# Patient Record
Sex: Female | Born: 1995 | Race: Black or African American | Hispanic: No | Marital: Single | Smoking: Never smoker
Health system: Southern US, Community
[De-identification: ages and names within clinical notes are randomized; demographics above are authoritative.]

---

## 2019-03-20 ENCOUNTER — Other Ambulatory Visit: Payer: Self-pay

## 2019-03-20 ENCOUNTER — Encounter (HOSPITAL_COMMUNITY): Payer: Self-pay | Admitting: Emergency Medicine

## 2019-03-20 ENCOUNTER — Emergency Department (HOSPITAL_COMMUNITY)
Admission: EM | Admit: 2019-03-20 | Discharge: 2019-03-20 | Disposition: A | Discharge status: Home / Self Care | Payer: Federal, State, Local not specified - PPO | Attending: Emergency Medicine | Admitting: Emergency Medicine

## 2019-03-20 ENCOUNTER — Emergency Department (HOSPITAL_COMMUNITY): Payer: Federal, State, Local not specified - PPO

## 2019-03-20 DIAGNOSIS — R51 Headache: Secondary | ICD-10-CM | POA: Insufficient documentation

## 2019-03-20 DIAGNOSIS — Z20828 Contact with and (suspected) exposure to other viral communicable diseases: Secondary | ICD-10-CM | POA: Insufficient documentation

## 2019-03-20 DIAGNOSIS — Z20822 Contact with and (suspected) exposure to covid-19: Secondary | ICD-10-CM

## 2019-03-20 DIAGNOSIS — R072 Precordial pain: Secondary | ICD-10-CM | POA: Diagnosis not present

## 2019-03-20 DIAGNOSIS — M791 Myalgia, unspecified site: Secondary | ICD-10-CM | POA: Insufficient documentation

## 2019-03-20 DIAGNOSIS — R6889 Other general symptoms and signs: Secondary | ICD-10-CM

## 2019-03-20 DIAGNOSIS — R079 Chest pain, unspecified: Secondary | ICD-10-CM | POA: Diagnosis present

## 2019-03-20 LAB — BASIC METABOLIC PANEL
Anion gap: 10 (ref 5–15)
BUN: 13 mg/dL (ref 6–20)
CO2: 23 mmol/L (ref 22–32)
Calcium: 9.5 mg/dL (ref 8.9–10.3)
Chloride: 102 mmol/L (ref 98–111)
Creatinine, Ser: 0.82 mg/dL (ref 0.44–1.00)
GFR calc Af Amer: >60 mL/min (ref >60)
GFR calc non Af Amer: >60 mL/min (ref >60)
Glucose, Bld: 102 mg/dL — ABNORMAL HIGH (ref 70–99)
Potassium: 3.6 mmol/L (ref 3.5–5.1)
Sodium: 135 mmol/L (ref 135–145)

## 2019-03-20 LAB — CBC
HCT: 42.4 % (ref 36.0–46.0)
Hemoglobin: 14 g/dL (ref 12.0–15.0)
MCH: 28.5 pg (ref 26.0–34.0)
MCHC: 33 g/dL (ref 30.0–36.0)
MCV: 86.2 fL (ref 80.0–100.0)
Platelets: 253 10*3/uL (ref 150–400)
RBC: 4.92 MIL/uL (ref 3.87–5.11)
RDW: 12.2 % (ref 11.5–15.5)
WBC: 3.8 10*3/uL — ABNORMAL LOW (ref 4.0–10.5)
nRBC: 0 % (ref 0.0–0.2)

## 2019-03-20 LAB — TROPONIN I: Troponin I: <0.03 ng/mL (ref <0.03)

## 2019-03-20 LAB — SARS CORONAVIRUS 2 BY RT PCR (HOSPITAL ORDER, PERFORMED IN ~~LOC~~ HOSPITAL LAB): SARS Coronavirus 2: NEGATIVE

## 2019-03-20 LAB — I-STAT BETA HCG BLOOD, ED (NOT ORDERABLE): I-stat hCG, quantitative: <5 m[IU]/mL (ref <5)

## 2019-03-20 NOTE — ED Triage Notes (Signed)
Pt c/o central chest tightness that started last night. Today has headache and leg pains. 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

## 2019-03-20 NOTE — ED Provider Notes (Signed)
[facility]-EMERGENCY DEPT Provider Note   CSN: [id number] Arrival date & time: 03/20/19  1303    History   Chief Complaint Chief Complaint  Patient presents with  . Chest Pain  . Headache  . Leg Pain    HPI    Tracy Mosley is a 23 y.o. female who presents to the ED with complaints of chest pain that began last night and continued today with associated headaches, lower body aches, nasal congestion that began today.  Patient works at [employer], and she is concerned for the possibility of this.  She describes her pain as 7/10 constant tightness in the center of her chest radiating somewhat into the upper back, worse with laying, sitting, or movement of her chest, and with no treatments tried prior to arrival.  This pain is unchanged with inspiration.  No known sick contacts.  No recent travel.  She is a non-smoker.  She is most concerned with the possibility of having COVID-19.  She has a maternal grandmother who passed away of an MI in her late 4s but no other known family history of cardiac disease.  She denies any cough, ear pain or drainage, sore throat, fevers, chills, diaphoresis, lightheadedness, shortness of breath, leg swelling, recent travel/surgery/immobilization, estrogen use, personal or family history of DVT/PE, abdominal pain, nausea, vomiting, diarrhea, constipation, dysuria, hematuria, numbness, tingling, focal weakness, claudication, orthopnea, or any other complaints at this time.  The history is provided by the patient and medical records. No language interpreter was used.  Chest Pain  Associated symptoms: headache   Associated symptoms: no abdominal pain, no cough, no diaphoresis, no fever, no nausea, no numbness, no shortness of breath, no vomiting and no weakness   Headache  Associated symptoms: congestion and myalgias   Associated symptoms: no abdominal pain, no cough, no diarrhea, no ear pain, no fever, no nausea, no  numbness, no sore throat, no vomiting and no weakness   Leg Pain  Associated symptoms: no fever     History reviewed. No pertinent past medical history.  There are no active problems to display for this patient.   History reviewed. No pertinent surgical history.   OB History   No obstetric history on file.      Home Medications    Prior to Admission medications   Not on File    Family History No family history on file.  Social History Social History   Tobacco Use  . Smoking status: Never Smoker  . Smokeless tobacco: Never Used  Substance Use Topics  . Alcohol use: Yes  . Drug use: Not on file     Allergies   Patient has no known allergies.   Review of Systems Review of Systems  Constitutional: Negative for chills, diaphoresis and fever.  HENT: Positive for congestion. Negative for ear discharge, ear pain and sore throat.   Respiratory: Negative for cough and shortness of breath.   Cardiovascular: Positive for chest pain. Negative for leg swelling.  Gastrointestinal: Negative for abdominal pain, constipation, diarrhea, nausea and vomiting.  Genitourinary: Negative for dysuria and hematuria.  Musculoskeletal: Positive for myalgias. Negative for arthralgias.  Skin: Negative for color change.  Allergic/Immunologic: Negative for immunocompromised state.  Neurological: Positive for headaches. Negative for weakness, light-headedness and numbness.  Psychiatric/Behavioral: Negative for confusion.   All other systems reviewed and are negative for acute change except as noted in the HPI.    Physical Exam Updated Vital Signs BP (!) 118/93 (BP Location: Right  Arm)   Pulse 74   Temp 98.4 F (36.9 C) (Oral)   Resp 12   Ht 5\' 4"  (1.626 m)   Wt 69.9 kg   LMP 03/10/2019   SpO2 100%   BMI 26.43 kg/m   Physical Exam Vitals signs and nursing note reviewed.  Constitutional:      General: She is not in acute distress.    Appearance: Normal appearance. She is  well-developed. She is not toxic-appearing.     Comments: Afebrile, nontoxic, NAD  HENT:     Head: Normocephalic and atraumatic.     Nose: Congestion present.     Comments: Mild congestion to nasal mucosa Eyes:     General:        Right eye: No discharge.        Left eye: No discharge.     Conjunctiva/sclera: Conjunctivae normal.  Neck:     Musculoskeletal: Normal range of motion and neck supple.  Cardiovascular:     Rate and Rhythm: Normal rate and regular rhythm.     Pulses: Normal pulses.     Heart sounds: Normal heart sounds, S1 normal and S2 normal. No murmur. No friction rub. No gallop.      Comments: RRR, nl s1/s2, no m/r/g, distal pulses intact, no pedal edema  Pulmonary:     Effort: Pulmonary effort is normal. No respiratory distress.     Breath sounds: Normal breath sounds. No decreased breath sounds, wheezing, rhonchi or rales.     Comments: CTAB in all lung fields, no w/r/r, no hypoxia or increased WOB, speaking in full sentences, SpO2 100% on RA  Chest:     Chest wall: Tenderness present. No deformity or crepitus.       Comments: Chest wall mildly TTP anteriorly and mostly centrally, without crepitus, deformities, or retractions  Abdominal:     General: Bowel sounds are normal. There is no distension.     Palpations: Abdomen is soft. Abdomen is not rigid.     Tenderness: There is no abdominal tenderness. There is no right CVA tenderness, left CVA tenderness, guarding or rebound. Negative signs include Murphy's sign and McBurney's sign.  Musculoskeletal: Normal range of motion.     Comments: MAE x4 Strength and sensation grossly intact in all extremities Distal pulses intact Gait steady No pedal edema, neg homan's bilaterally   Skin:    General: Skin is warm and dry.     Findings: No rash.  Neurological:     Mental Status: She is alert and oriented to person, place, and time.     Sensory: Sensation is intact. No sensory deficit.     Motor: Motor function is  intact.  Psychiatric:        Mood and Affect: Mood and affect normal.        Behavior: Behavior normal.      ED Treatments / Results  Labs (all labs ordered are listed, but only abnormal results are displayed) Labs Reviewed  BASIC METABOLIC PANEL - Abnormal; Notable for the following components:      Result Value   Glucose, Bld 102 (*)    All other components within normal limits  CBC - Abnormal; Notable for the following components:   WBC 3.8 (*)    All other components within normal limits  NOVEL CORONAVIRUS, NAA (HOSPITAL ORDER, SEND-OUT TO REF LAB)  TROPONIN I  I-STAT BETA HCG BLOOD, ED (NOT ORDERABLE)    EKG None  Radiology Dg Chest 2 View  Result  Date: 03/20/2019 CLINICAL DATA:  Chest pain EXAM: CHEST - 2 VIEW COMPARISON:  None. FINDINGS: The lungs are clear. Heart size and pulmonary vascularity are normal. No adenopathy. No pneumothorax. No bone lesions. IMPRESSION: No abnormality noted. Electronically Signed   By: Bretta Bang III M.D.   On: 03/20/2019 13:47    Procedures Procedures (including critical care time)  Medications Ordered in ED Medications - No data to display   Initial Impression / Assessment and Plan / ED Course  I have reviewed the triage vital signs and the nursing notes.  Pertinent labs & imaging results that were available during my care of the patient were reviewed by me and considered in my medical decision making (see chart for details).        23 y.o. female here with chest tightness that began yesterday as well as headaches, body aches, nasal congestion that began today.  Patient works at [employer] and she is most concerned about this possibility.  On exam, mild nasal congestion, lung sounds clear, mild chest wall tenderness anteriorly, no leg swelling or tachycardia, PERC negative, afebrile and nontoxic, well-appearing.  Work-up thus far reveals: BMP WNL, CBC fairly unremarkable, trop neg at >12hr mark, betaHCG neg,  CXR neg, EKG nonischemic and without acute findings. COVID19 is a possibility given her symptoms, pt is concerned for this and would like testing, discussed that this would be outpatient testing and may have prolonged turn around time. Advised that she should still self-quarantine, guidelines of this advised. Doubt PE, dissection, ACS, etc. Doubt need for further emergent work up at this time. Suspect chest pain is musculoskeletal in origin. Advised OTC remedies for symptomatic relief, f/up with PCP in 1wk. I explained the diagnosis and have given explicit precautions to return to the ER including for any other new or worsening symptoms. The patient understands and accepts the medical plan as it's been dictated and I have answered their questions. Discharge instructions concerning home care and prescriptions have been given. The patient is STABLE and is discharged to home in good condition.    Final Clinical Impressions(s) / ED Diagnoses   Final diagnoses:  Precordial chest pain  Suspected Covid-19 Virus Infection    ED Discharge Orders    [address] [place], [place] 03/20/19 1628    Tilden Fossa, MD 03/20/19 [phone]

## 2019-03-20 NOTE — Discharge Instructions (Addendum)
You are being tested for the Novel Coronavirus infection. Please quarantine yourself for at least 7 days from symptom onset and at least 3 days fever free without medications (whichever is longer). Continue to stay well-hydrated. Gargle warm salt water and spit it out and use chloraseptic spray as needed for sore throat. Continue to alternate between Tylenol and Ibuprofen for pain or fever. Use Mucinex/Robitussin/etc for cough suppression/expectoration of mucus. Use over the counter flonase and the netipot to help with nasal congestion. May consider over-the-counter Benadryl or other antihistamine like Claritin/Zyrtec/etc to decrease secretions and for help with your symptoms. Follow up with your primary care doctor in 5-7 days for recheck of ongoing symptoms. Return to emergency department for emergent changing or worsening of symptoms.     Person Under Monitoring Name: Tracy Mosley  Location: [address] [place] [id number]   Infection Prevention Recommendations for Individuals Confirmed to have, or Being Evaluated for, 2019 Novel Coronavirus (COVID-19) Infection Who Receive Care at Home  Individuals who are confirmed to have, or are being evaluated for, COVID-19 should follow the prevention steps below until a healthcare provider or local or state health department says they can return to normal activities.  Stay home except to get medical care You should restrict activities outside your home, except for getting medical care. Do not go to work, school, or public areas, and do not use public transportation or taxis.  Call ahead before visiting your doctor Before your medical appointment, call the healthcare provider and tell them that you have, or are being evaluated for, COVID-19 infection. This will help the healthcare providers office take steps to keep other people from getting infected. Ask your healthcare provider to call the local or state health department.  Monitor  your symptoms Seek prompt medical attention if your illness is worsening (e.g., difficulty breathing). Before going to your medical appointment, call the healthcare provider and tell them that you have, or are being evaluated for, COVID-19 infection. Ask your healthcare provider to call the local or state health department.  Wear a facemask You should wear a facemask that covers your nose and mouth when you are in the same room with other people and when you visit a healthcare provider. People who live with or visit you should also wear a facemask while they are in the same room with you.  Separate yourself from other people in your home As much as possible, you should stay in a different room from other people in your home. Also, you should use a separate bathroom, if available.  Avoid sharing household items You should not share dishes, drinking glasses, cups, eating utensils, towels, bedding, or other items with other people in your home. After using these items, you should wash them thoroughly with soap and water.  Cover your coughs and sneezes Cover your mouth and nose with a tissue when you cough or sneeze, or you can cough or sneeze into your sleeve. Throw used tissues in a lined trash can, and immediately wash your hands with soap and water for at least 20 seconds or use an alcohol-based hand rub.  Wash your Union Pacific Corporationhands Wash your hands often and thoroughly with soap and water for at least 20 seconds. You can use an alcohol-based hand sanitizer if soap and water are not available and if your hands are not visibly dirty. Avoid touching your eyes, nose, and mouth with unwashed hands.   Prevention Steps for Caregivers and Household Members of Individuals Confirmed to have,  or Being Evaluated for, COVID-19 Infection Being Cared for in the Home  If you live with, or provide care at home for, a person confirmed to have, or being evaluated for, COVID-19 infection please follow these  guidelines to prevent infection:  Follow healthcare providers instructions Make sure that you understand and can help the patient follow any healthcare provider instructions for all care.  Provide for the patients basic needs You should help the patient with basic needs in the home and provide support for getting groceries, prescriptions, and other personal needs.  Monitor the patients symptoms If they are getting sicker, call his or her medical provider and tell them that the patient has, or is being evaluated for, COVID-19 infection. This will help the healthcare providers office take steps to keep other people from getting infected. Ask the healthcare provider to call the local or state health department.  Limit the number of people who have contact with the patient If possible, have only one caregiver for the patient. Other household members should stay in another home or place of residence. If this is not possible, they should stay in another room, or be separated from the patient as much as possible. Use a separate bathroom, if available. Restrict visitors who do not have an essential need to be in the home.  Keep older adults, very young children, and other sick people away from the patient Keep older adults, very young children, and those who have compromised immune systems or chronic health conditions away from the patient. This includes people with chronic heart, lung, or kidney conditions, diabetes, and cancer.  Ensure good ventilation Make sure that shared spaces in the home have good air flow, such as from an air conditioner or an opened window, weather permitting.  Wash your hands often Wash your hands often and thoroughly with soap and water for at least 20 seconds. You can use an alcohol based hand sanitizer if soap and water are not available and if your hands are not visibly dirty. Avoid touching your eyes, nose, and mouth with unwashed hands. Use disposable paper  towels to dry your hands. If not available, use dedicated cloth towels and replace them when they become wet.  Wear a facemask and gloves Wear a disposable facemask at all times in the room and gloves when you touch or have contact with the patients blood, body fluids, and/or secretions or excretions, such as sweat, saliva, sputum, nasal mucus, vomit, urine, or feces.  Ensure the mask fits over your nose and mouth tightly, and do not touch it during use. Throw out disposable facemasks and gloves after using them. Do not reuse. Wash your hands immediately after removing your facemask and gloves. If your personal clothing becomes contaminated, carefully remove clothing and launder. Wash your hands after handling contaminated clothing. Place all used disposable facemasks, gloves, and other waste in a lined container before disposing them with other household waste. Remove gloves and wash your hands immediately after handling these items.  Do not share dishes, glasses, or other household items with the patient Avoid sharing household items. You should not share dishes, drinking glasses, cups, eating utensils, towels, bedding, or other items with a patient who is confirmed to have, or being evaluated for, COVID-19 infection. After the person uses these items, you should wash them thoroughly with soap and water.  Wash laundry thoroughly Immediately remove and wash clothes or bedding that have blood, body fluids, and/or secretions or excretions, such as sweat, saliva, sputum, nasal  mucus, vomit, urine, or feces, on them. Wear gloves when handling laundry from the patient. Read and follow directions on labels of laundry or clothing items and detergent. In general, wash and dry with the warmest temperatures recommended on the label.  Clean all areas the individual has used often Clean all touchable surfaces, such as counters, tabletops, doorknobs, bathroom fixtures, toilets, phones, keyboards, tablets,  and bedside tables, every day. Also, clean any surfaces that may have blood, body fluids, and/or secretions or excretions on them. Wear gloves when cleaning surfaces the patient has come in contact with. Use a diluted bleach solution (e.g., dilute bleach with 1 part bleach and 10 parts water) or a household disinfectant with a label that says EPA-registered for coronaviruses. To make a bleach solution at home, add 1 tablespoon of bleach to 1 quart (4 cups) of water. For a larger supply, add  cup of bleach to 1 gallon (16 cups) of water. Read labels of cleaning products and follow recommendations provided on product labels. Labels contain instructions for safe and effective use of the cleaning product including precautions you should take when applying the product, such as wearing gloves or eye protection and making sure you have good ventilation during use of the product. Remove gloves and wash hands immediately after cleaning.  Monitor yourself for signs and symptoms of illness Caregivers and household members are considered close contacts, should monitor their health, and will be asked to limit movement outside of the home to the extent possible. Follow the monitoring steps for close contacts listed on the symptom monitoring form.   ? If you have additional questions, contact your local health department or call the epidemiologist on call at [phone] (available 24/7). ? This guidance is subject to change. For the most up-to-date guidance from [facility], please refer to their website: [email or web address]

## 2019-03-27 ENCOUNTER — Telehealth (HOSPITAL_COMMUNITY): Payer: Self-pay

## 2020-03-31 IMAGING — CR CHEST - 2 VIEW
2 series · 2 of 2 positions shown · non-contrast
Comparison: None.

CLINICAL DATA: Chest pain

EXAM:
CHEST - 2 VIEW

[w chest pa]
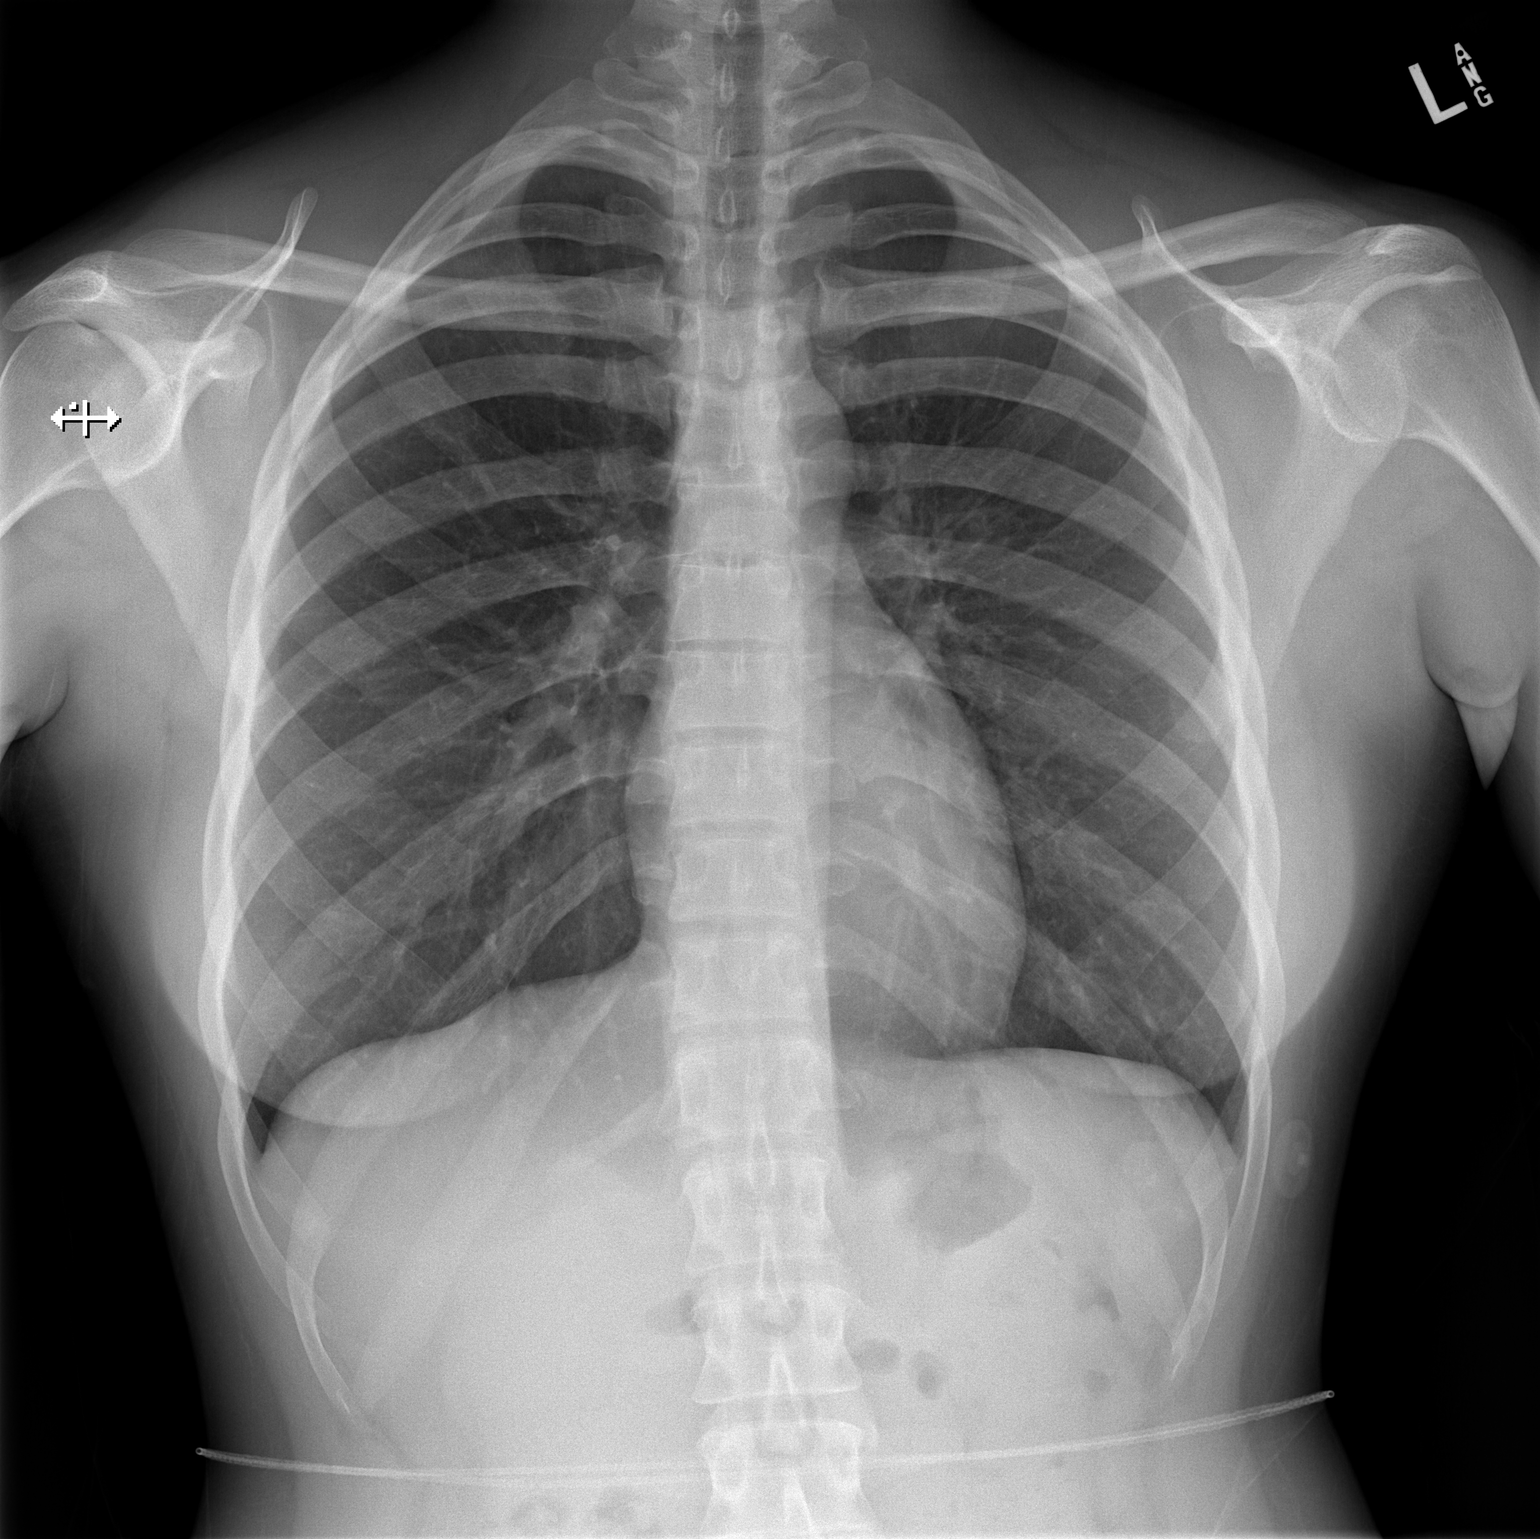

[w chest lat]
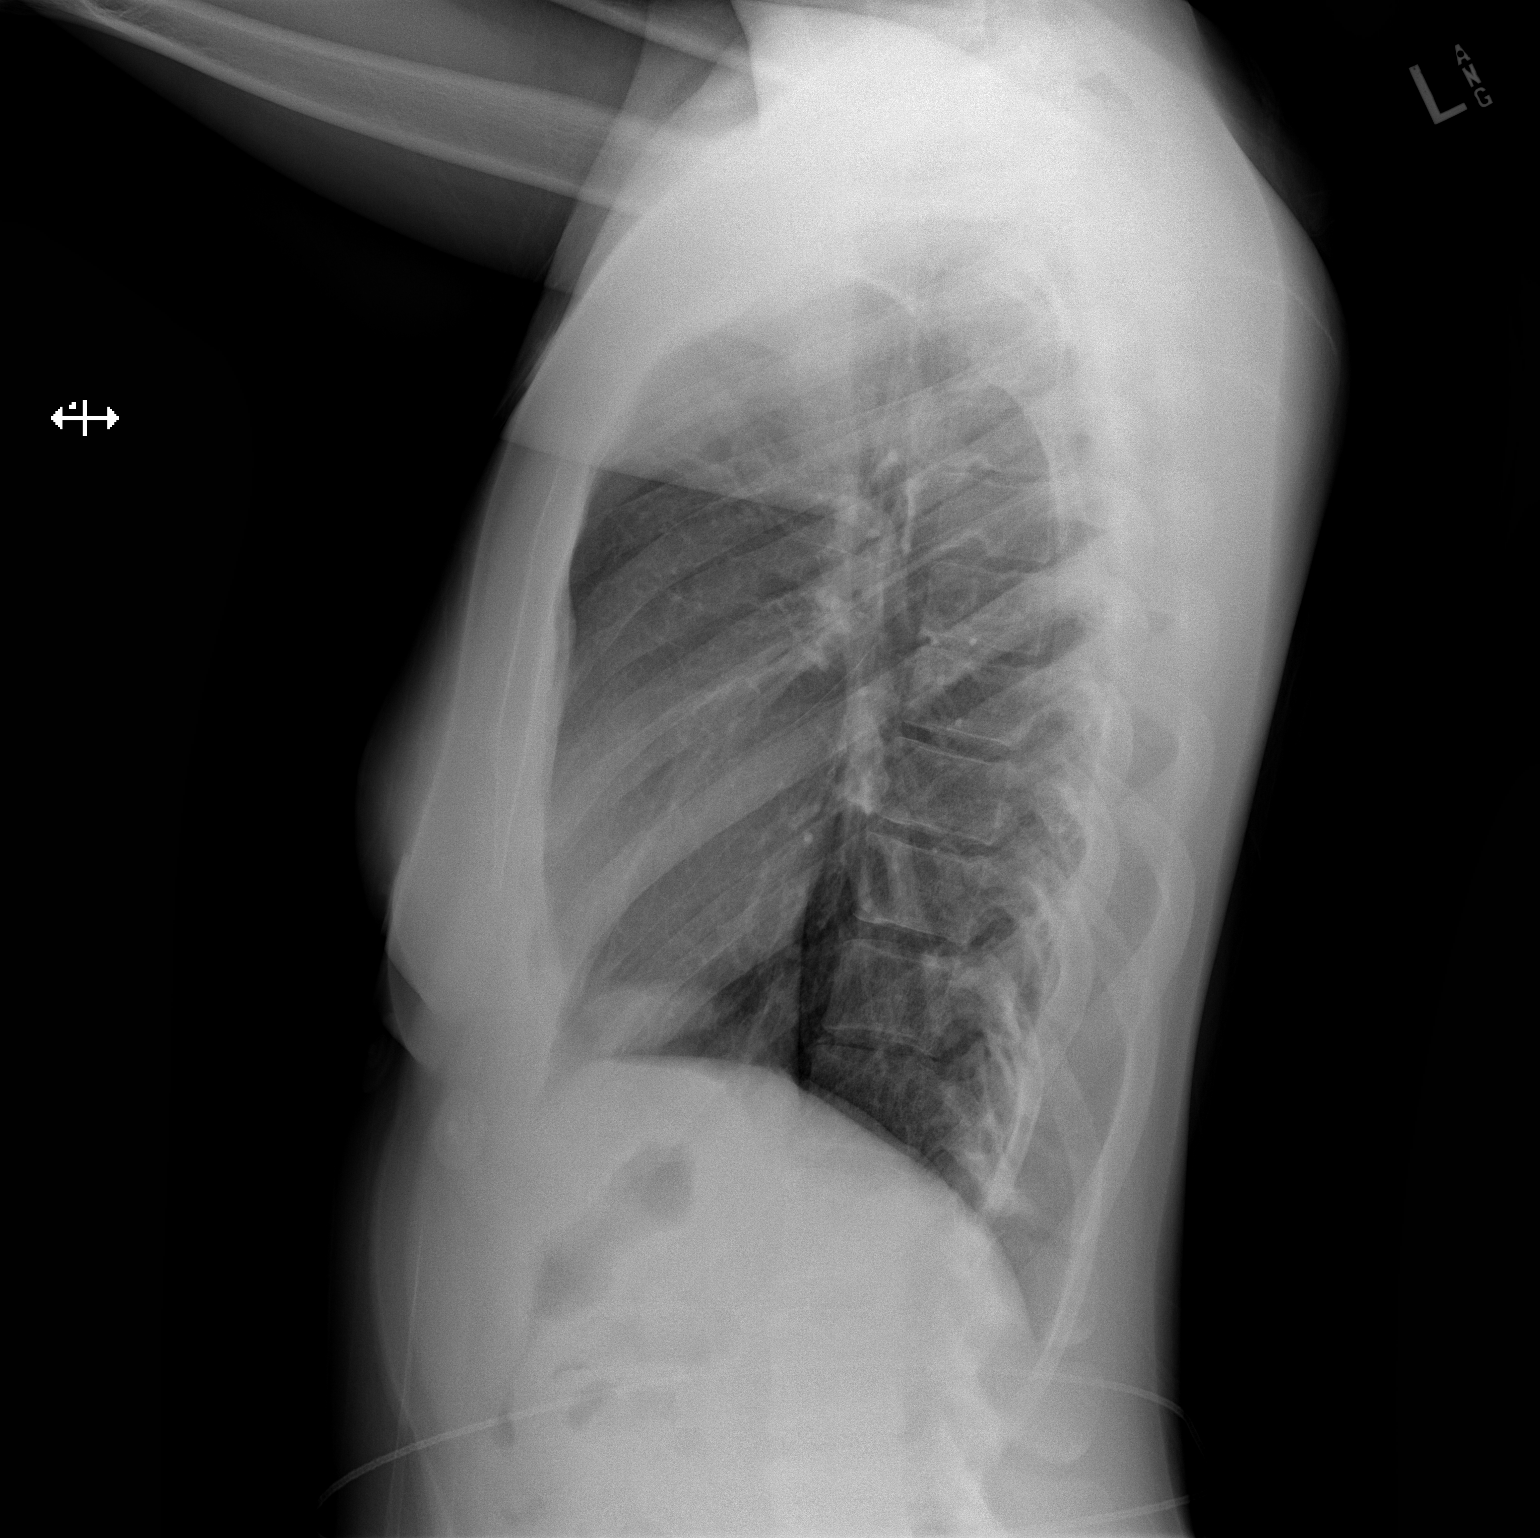

[2 of 2 positions shown; findings below may reference images not displayed]

FINDINGS: The lungs are clear. Heart size and pulmonary vascularity are
normal. No adenopathy. No pneumothorax. No bone lesions.
IMPRESSION: No abnormality noted.
